# Patient Record
Sex: Female | Born: 1965 | Race: White | Hispanic: No | Smoking: Never smoker
Health system: Southern US, Community
[De-identification: ages and names within clinical notes are randomized; demographics above are authoritative.]

## PROBLEM LIST (undated history)

## (undated) ENCOUNTER — Inpatient Hospital Stay (HOSPITAL_COMMUNITY): Payer: PRIVATE HEALTH INSURANCE

## (undated) DIAGNOSIS — F419 Anxiety disorder, unspecified: Secondary | ICD-10-CM

## (undated) DIAGNOSIS — K219 Gastro-esophageal reflux disease without esophagitis: Secondary | ICD-10-CM

## (undated) DIAGNOSIS — F32A Depression, unspecified: Secondary | ICD-10-CM

## (undated) DIAGNOSIS — F329 Major depressive disorder, single episode, unspecified: Secondary | ICD-10-CM

## (undated) HISTORY — PX: NO PAST SURGERIES: SHX2092

---

## 2001-02-12 ENCOUNTER — Other Ambulatory Visit: Admission: RE | Admit: 2001-02-12 | Discharge: 2001-02-12 | Payer: Self-pay | Admitting: Obstetrics and Gynecology

## 2003-04-19 ENCOUNTER — Other Ambulatory Visit: Admission: RE | Admit: 2003-04-19 | Discharge: 2003-04-19 | Payer: Self-pay | Admitting: Obstetrics and Gynecology

## 2005-03-12 ENCOUNTER — Other Ambulatory Visit: Admission: RE | Admit: 2005-03-12 | Discharge: 2005-03-12 | Payer: Self-pay | Admitting: Obstetrics and Gynecology

## 2005-03-14 ENCOUNTER — Encounter: Admission: RE | Admit: 2005-03-14 | Discharge: 2005-03-14 | Payer: Self-pay | Admitting: Obstetrics and Gynecology

## 2006-09-01 ENCOUNTER — Other Ambulatory Visit: Admission: RE | Admit: 2006-09-01 | Discharge: 2006-09-01 | Payer: Self-pay | Admitting: Obstetrics and Gynecology

## 2008-11-08 ENCOUNTER — Encounter: Admission: RE | Admit: 2008-11-08 | Discharge: 2008-11-08 | Payer: Self-pay | Admitting: Obstetrics and Gynecology

## 2009-11-22 ENCOUNTER — Encounter: Admission: RE | Admit: 2009-11-22 | Discharge: 2009-11-22 | Payer: Self-pay | Admitting: Obstetrics and Gynecology

## 2011-05-29 ENCOUNTER — Other Ambulatory Visit: Payer: Self-pay | Admitting: Obstetrics and Gynecology

## 2011-05-29 DIAGNOSIS — Z1231 Encounter for screening mammogram for malignant neoplasm of breast: Secondary | ICD-10-CM

## 2011-06-11 ENCOUNTER — Ambulatory Visit
Admission: RE | Admit: 2011-06-11 | Discharge: 2011-06-11 | Disposition: A | Discharge status: Home / Self Care | Payer: PRIVATE HEALTH INSURANCE | Source: Ambulatory Visit | Attending: Obstetrics and Gynecology | Admitting: Obstetrics and Gynecology

## 2011-06-11 DIAGNOSIS — Z1231 Encounter for screening mammogram for malignant neoplasm of breast: Secondary | ICD-10-CM

## 2011-06-25 ENCOUNTER — Encounter (HOSPITAL_COMMUNITY): Payer: Self-pay | Admitting: *Deleted

## 2011-07-05 ENCOUNTER — Other Ambulatory Visit: Payer: Self-pay | Admitting: Obstetrics and Gynecology

## 2011-07-05 ENCOUNTER — Encounter (HOSPITAL_COMMUNITY): Payer: Self-pay | Admitting: *Deleted

## 2011-07-05 ENCOUNTER — Ambulatory Visit (HOSPITAL_COMMUNITY): Payer: PRIVATE HEALTH INSURANCE | Admitting: Anesthesiology

## 2011-07-05 ENCOUNTER — Ambulatory Visit (HOSPITAL_COMMUNITY)
Admission: AD | Admit: 2011-07-05 | Discharge: 2011-07-05 | Disposition: A | Discharge status: Home / Self Care | Payer: PRIVATE HEALTH INSURANCE | Source: Ambulatory Visit | Attending: Obstetrics and Gynecology | Admitting: Obstetrics and Gynecology

## 2011-07-05 ENCOUNTER — Encounter (HOSPITAL_COMMUNITY)
Admission: AD | Disposition: A | Discharge status: Home / Self Care | Payer: Self-pay | Source: Ambulatory Visit | Attending: Obstetrics and Gynecology

## 2011-07-05 ENCOUNTER — Encounter (HOSPITAL_COMMUNITY): Payer: Self-pay | Admitting: Anesthesiology

## 2011-07-05 DIAGNOSIS — N949 Unspecified condition associated with female genital organs and menstrual cycle: Secondary | ICD-10-CM | POA: Insufficient documentation

## 2011-07-05 DIAGNOSIS — N938 Other specified abnormal uterine and vaginal bleeding: Secondary | ICD-10-CM | POA: Insufficient documentation

## 2011-07-05 DIAGNOSIS — N840 Polyp of corpus uteri: Secondary | ICD-10-CM | POA: Insufficient documentation

## 2011-07-05 DIAGNOSIS — F329 Major depressive disorder, single episode, unspecified: Secondary | ICD-10-CM | POA: Insufficient documentation

## 2011-07-05 DIAGNOSIS — N926 Irregular menstruation, unspecified: Secondary | ICD-10-CM | POA: Diagnosis present

## 2011-07-05 HISTORY — DX: Depression, unspecified: F32.A

## 2011-07-05 HISTORY — DX: Anxiety disorder, unspecified: F41.9

## 2011-07-05 HISTORY — DX: Gastro-esophageal reflux disease without esophagitis: K21.9

## 2011-07-05 HISTORY — DX: Major depressive disorder, single episode, unspecified: F32.9

## 2011-07-05 LAB — CBC
HCT: 38 % (ref 36.0–46.0)
WBC: 6.2 10*3/uL (ref 4.0–10.5)

## 2011-07-05 LAB — PREGNANCY, URINE: Preg Test, Ur: NEGATIVE

## 2011-07-05 SURGERY — DILATATION & CURETTAGE/HYSTEROSCOPY WITH RESECTOCOPE
Anesthesia: General

## 2011-07-05 MED ORDER — IBUPROFEN 200 MG PO TABS: 600.0000 mg | ORAL_TABLET | Freq: Four times a day (QID) | ORAL | Status: AC

## 2011-07-05 MED ORDER — PROPOFOL 10 MG/ML IV EMUL
INTRAVENOUS | Status: DC | PRN
  Administered 2011-07-05: 200 mg via INTRAVENOUS

## 2011-07-05 MED ORDER — DEXAMETHASONE SODIUM PHOSPHATE 10 MG/ML IJ SOLN
INTRAMUSCULAR | Status: AC
  Filled 2011-07-05: qty 1

## 2011-07-05 MED ORDER — KETOROLAC TROMETHAMINE 60 MG/2ML IM SOLN
INTRAMUSCULAR | Status: DC | PRN
  Administered 2011-07-05: 30 mg via INTRAMUSCULAR

## 2011-07-05 MED ORDER — FENTANYL CITRATE 0.05 MG/ML IJ SOLN
INTRAMUSCULAR | Status: DC | PRN
  Administered 2011-07-05 (×2): 50 ug via INTRAVENOUS

## 2011-07-05 MED ORDER — ONDANSETRON HCL 4 MG/2ML IJ SOLN: 4.0000 mg | Freq: Once | INTRAMUSCULAR | Status: DC | PRN

## 2011-07-05 MED ORDER — PROPOFOL 10 MG/ML IV EMUL
INTRAVENOUS | Status: AC
  Filled 2011-07-05: qty 20

## 2011-07-05 MED ORDER — GLYCINE 1.5 % IR SOLN
Status: DC | PRN
  Administered 2011-07-05: 1

## 2011-07-05 MED ORDER — LIDOCAINE HCL (CARDIAC) 20 MG/ML IV SOLN
INTRAVENOUS | Status: AC
  Filled 2011-07-05: qty 5

## 2011-07-05 MED ORDER — LIDOCAINE HCL (CARDIAC) 20 MG/ML IV SOLN
INTRAVENOUS | Status: DC | PRN
  Administered 2011-07-05: 100 mg via INTRAVENOUS

## 2011-07-05 MED ORDER — ONDANSETRON HCL 4 MG/2ML IJ SOLN
INTRAMUSCULAR | Status: AC
  Filled 2011-07-05: qty 2

## 2011-07-05 MED ORDER — KETOROLAC TROMETHAMINE 30 MG/ML IJ SOLN
INTRAMUSCULAR | Status: DC | PRN
  Administered 2011-07-05: 30 mg via INTRAVENOUS

## 2011-07-05 MED ORDER — ACETAMINOPHEN 325 MG PO TABS: 325.0000 mg | ORAL_TABLET | ORAL | Status: DC | PRN

## 2011-07-05 MED ORDER — FENTANYL CITRATE 0.05 MG/ML IJ SOLN
INTRAMUSCULAR | Status: AC
  Filled 2011-07-05: qty 2

## 2011-07-05 MED ORDER — MIDAZOLAM HCL 2 MG/2ML IJ SOLN
INTRAMUSCULAR | Status: AC
  Filled 2011-07-05: qty 2

## 2011-07-05 MED ORDER — MIDAZOLAM HCL 5 MG/5ML IJ SOLN
INTRAMUSCULAR | Status: DC | PRN
  Administered 2011-07-05: 2 mg via INTRAVENOUS

## 2011-07-05 MED ORDER — LACTATED RINGERS IV SOLN
INTRAVENOUS | Status: DC
  Administered 2011-07-05 (×5): via INTRAVENOUS

## 2011-07-05 MED ORDER — MEPERIDINE HCL 25 MG/ML IJ SOLN: 6.2500 mg | INTRAMUSCULAR | Status: DC | PRN

## 2011-07-05 MED ORDER — KETOROLAC TROMETHAMINE 30 MG/ML IJ SOLN
INTRAMUSCULAR | Status: AC
  Filled 2011-07-05: qty 1

## 2011-07-05 MED ORDER — ONDANSETRON HCL 4 MG/2ML IJ SOLN
INTRAMUSCULAR | Status: DC | PRN
  Administered 2011-07-05: 4 mg via INTRAVENOUS

## 2011-07-05 MED ORDER — DEXAMETHASONE SODIUM PHOSPHATE 10 MG/ML IJ SOLN
INTRAMUSCULAR | Status: DC | PRN
  Administered 2011-07-05: 10 mg via INTRAVENOUS

## 2011-07-05 MED ORDER — SODIUM CHLORIDE 0.9 % IR SOLN
Status: DC | PRN
  Administered 2011-07-05: 1000 mL

## 2011-07-05 MED ORDER — LIDOCAINE HCL 2 % IJ SOLN
INTRAMUSCULAR | Status: DC | PRN
  Administered 2011-07-05: 10 mL via INTRADERMAL

## 2011-07-05 MED ORDER — FENTANYL CITRATE 0.05 MG/ML IJ SOLN: 25.0000 ug | INTRAMUSCULAR | Status: DC | PRN

## 2011-07-05 SURGICAL SUPPLY — 25 items
BOOTIES KNEE HIGH SLOAN (MISCELLANEOUS) ×4 IMPLANT
CANISTER SUCTION 2500CC (MISCELLANEOUS) ×2 IMPLANT
CATH ROBINSON RED A/P 16FR (CATHETERS) ×2 IMPLANT
CLOTH BEACON ORANGE TIMEOUT ST (SAFETY) ×2 IMPLANT
CONTAINER PREFILL 10% NBF 60ML (FORM) ×4 IMPLANT
CORD ACTIVE DISPOSABLE (ELECTRODE) ×1
CORD ELECTRO ACTIVE DISP (ELECTRODE) ×1 IMPLANT
DILATOR CANAL MILEX (MISCELLANEOUS) IMPLANT
DRAPE UTILITY XL STRL (DRAPES) ×4 IMPLANT
ELECT LOOP GYNE PRO 24FR (CUTTING LOOP) ×2
ELECT REM PT RETURN 9FT ADLT (ELECTROSURGICAL) ×2
ELECT VAPORTRODE GRVD BAR (ELECTRODE) IMPLANT
ELECTRODE LOOP GYNE PRO 24FR (CUTTING LOOP) IMPLANT
ELECTRODE REM PT RTRN 9FT ADLT (ELECTROSURGICAL) ×1 IMPLANT
ELECTRODE ROLLER VERSAPOINT (ELECTRODE) IMPLANT
ELECTRODE RT ANGLE VERSAPOINT (CUTTING LOOP) IMPLANT
ELECTRODE TWIZZLE TIP (MISCELLANEOUS) IMPLANT
GLOVE ECLIPSE 6.0 STRL STRAW (GLOVE) ×2 IMPLANT
GLOVE INDICATOR 6.5 STRL GRN (GLOVE) ×2 IMPLANT
GLOVE SURG SS PI 6.5 STRL IVOR (GLOVE) ×4 IMPLANT
GOWN BRE IMP SLV AUR LG STRL (GOWN DISPOSABLE) ×4 IMPLANT
LOOP ANGLED CUTTING 22FR (CUTTING LOOP) IMPLANT
PACK HYSTEROSCOPY LF (CUSTOM PROCEDURE TRAY) ×2 IMPLANT
TOWEL OR 17X24 6PK STRL BLUE (TOWEL DISPOSABLE) ×4 IMPLANT
WATER STERILE IRR 1000ML POUR (IV SOLUTION) ×2 IMPLANT

## 2011-07-05 NOTE — Op Note (Signed)
PROCEDURE NOTE  INDICATIONS:abnormal uterine bleeding and menorrhagia Polyp on sonohystogram  PRE-OP DIAGNOSIS:menorrhagia;endometrial polyp   POST-OP DIAGNOSIS:same .  Procedure(s): DILATATION & CURRETTAGE/HYSTEROSCOPY WITH RESECTOCOPE   SURGEON:Lylla Eifler P  ASSIST:None  SPECIMENS:Endometrial currettings and polyp DISPOSITION OF SPECIMEN:  To Pathology FINDINGS: the uterus sounded to 9 cm. At the time of hysteroscopy the endometrium was fluffy throughout without a specific lesion identified.  EBL:<100cc  PATIENT TO PACU IN good CONDITION  PROCEDURE DETAILS:patient was taken to the operating room after appropriate identification and placed on the operating table in the lithotomy position. The perineum was prepped with multiple layers of Betadine and draped as a sterile field. A red Robinson catheter was used to empty the bladder. The perineum was draped as a sterile field. A Gray speculum was placed in the vagina and a paracervical block achieved a total of 10 cc of 2% Xylocaine and 10:00 positions. A single-tooth tenaculum was placed on the anterior cervix. The uterus sounded to 9 cm. The cervix was then dilated to accommodate the diagnostic hysteroscope. The hysteroscope was placed with the finding fluffy endometrium throughout oriented without a dominate polyp identified. The right TM left identified to ensure that the placement last intracavitary. The hysteroscope was removed and a medium-sized curet used to curet all 4 quadrants of the uterus. The hysteroscope was reinserted and with the finding of a clear endometrial cavity without specific lesions. The specimens were removed from the operative field and subsequently sent to pathology. The patient had all instruments removed from the vagina and was awakened from general anesthesia. She was taken to the recovery room in satisfactory condition having tolerated the procedure well the sponge and instrument counts correct.  Discharge  instructions: The patient is given printed discharge instructions from the [facility] for hysteroscopy.  Discharge medications.: See medical reconciliation.  Followup instructions: The patient will followup with Dr. Pennie Rushing in 2 weeks at [facility].

## 2011-07-05 NOTE — Anesthesia Procedure Notes (Signed)
Procedure Name: LMA Insertion Date/Time: 07/05/2011 9:12 AM Performed by: Harriett Rush, Marlies Ligman ADEDAYO Pre-anesthesia Checklist: Patient identified Patient Re-evaluated:Patient Re-evaluated prior to inductionOxygen Delivery Method: Circle System Utilized Preoxygenation: Pre-oxygenation with 100% oxygen Intubation Type: IV induction LMA: LMA inserted LMA Size: 4.0 Number of attempts: 1

## 2011-07-05 NOTE — Anesthesia Preprocedure Evaluation (Addendum)
Anesthesia Evaluation  Name, MR# and DOB Patient awake  General Assessment Comment  Reviewed: Allergy & Precautions, H&P , Patient's Chart, lab work & pertinent test results and reviewed documented beta blocker date and time   History of Anesthesia Complications (+) PONV  Airway Mallampati: II TM Distance: >3 FB Neck ROM: full    Dental No notable dental hx    Pulmonaryneg pulmonary ROS    clear to auscultation  pulmonary exam normal   Cardiovascular regular Normal   Neuro/PsychNegative Neurological ROS Negative Psych ROS  GI/Hepatic/Renal negative GI ROS, negative Liver ROS, and negative Renal ROS (+)  GERD Medicated and Controlled     Endo/Other  Negative Endocrine ROS (+)  Morbid obesity Abdominal   Musculoskeletal  Hematology negative hematology ROS (+)   Peds  Reproductive/Obstetrics   Anesthesia Other Findings              Anesthesia Physical Anesthesia Plan  ASA: II  Anesthesia Plan: General   Post-op Pain Management:    Induction: Intravenous  Airway Management Planned: LMA  Additional Equipment:   Intra-op Plan:   Post-operative Plan:   Informed Consent: I have reviewed the patients History and Physical, chart, labs and discussed the procedure including the risks, benefits and alternatives for the proposed anesthesia with the patient or authorized representative who has indicated his/her understanding and acceptance.   Dental Advisory Given  Plan Discussed with: CRNA and Surgeon  Anesthesia Plan Comments: (  Discussed  general anesthesia, including possible nausea, instrumentation of airway, sore throat,pulmonary aspiration, etc. I asked if the were any outstanding questions, or  concerns before we proceeded. )    Anesthesia Quick Evaluation

## 2011-07-05 NOTE — Anesthesia Postprocedure Evaluation (Signed)
  Anesthesia Post-op Note  Patient: Katelyn Collier  Procedure(s) Performed:  DILATATION & CURRETTAGE/HYSTEROSCOPY WITH RESECTOCOPE Patient is awake and responsive. Pain and nausea are reasonably well controlled. Vital signs are stable and clinically acceptable. Oxygen saturation is clinically acceptable. There are no apparent anesthetic complications at this time. Patient is ready for discharge.

## 2011-07-05 NOTE — H&P (Signed)
Update H&P No changes in H&P

## 2011-07-05 NOTE — Transfer of Care (Signed)
Immediate Anesthesia Transfer of Care Note  Patient: Katelyn Collier  Procedure(s) Performed:  DILATATION & CURRETTAGE/HYSTEROSCOPY WITH RESECTOCOPE  Patient Location: PACU  Anesthesia Type: General  Level of Consciousness: awake, alert  and oriented  Airway & Oxygen Therapy: Patient Spontanous Breathing, Patient connected to nasal cannula oxygen and Patient connected to face mask oxygen  Post-op Assessment: Report given to PACU RN, Post -op Vital signs reviewed and stable and Patient moving all extremities X 4  Post vital signs: Reviewed and stable  Complications: No apparent anesthesia complications

## 2011-07-10 NOTE — Addendum Note (Signed)
Addendum  created 07/10/11 1419 by Quinnlan Abruzzo Edward Amory Simonetti   Modules edited:Notes Section    

## 2013-04-01 ENCOUNTER — Other Ambulatory Visit: Payer: Self-pay

## 2013-04-01 DIAGNOSIS — Z1231 Encounter for screening mammogram for malignant neoplasm of breast: Secondary | ICD-10-CM

## 2013-04-12 ENCOUNTER — Ambulatory Visit
Admission: RE | Admit: 2013-04-12 | Discharge: 2013-04-12 | Disposition: A | Discharge status: Home / Self Care | Payer: BC Managed Care – PPO | Source: Ambulatory Visit

## 2013-04-12 DIAGNOSIS — Z1231 Encounter for screening mammogram for malignant neoplasm of breast: Secondary | ICD-10-CM

## 2018-02-05 ENCOUNTER — Other Ambulatory Visit: Payer: Self-pay | Admitting: Obstetrics and Gynecology

## 2018-02-05 DIAGNOSIS — N644 Mastodynia: Secondary | ICD-10-CM

## 2018-02-10 ENCOUNTER — Ambulatory Visit
Admission: RE | Admit: 2018-02-10 | Discharge: 2018-02-10 | Disposition: A | Discharge status: Home / Self Care | Payer: Managed Care, Other (non HMO) | Source: Ambulatory Visit | Attending: Obstetrics and Gynecology | Admitting: Obstetrics and Gynecology

## 2018-02-10 DIAGNOSIS — N644 Mastodynia: Secondary | ICD-10-CM

## 2018-02-17 ENCOUNTER — Other Ambulatory Visit: Payer: Self-pay | Admitting: Obstetrics and Gynecology

## 2022-05-15 NOTE — Progress Notes (Unsigned)
[facility] [address] [place] [address] Phone: [phone] Subjective:   Katelyn Collier, am serving as a scribe for Dr. Antoine Primas.  I'm seeing this patient by the request  of:  Haygood, Maris Berger, MD (Inactive)  CC: Hip and back pain   TIW:[id number]  Katelyn Collier is a 56 y.o. female coming in with complaint of hip and back pain. 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 Take tylenols to help with pain. Pain over SI mainly over right side. Burning pain in hip when sleeping. When going up steps pain on anterior thigh. Going through menopause. Took celebrex in Nov. Did PT. Works at [employer]. Feels better since taking 300mg  gabapentin. Patient denies any history of injury recently.  Patient feels like she is doing most of these things without any significant improvement yet.  Looking for other treatment options.      Past Medical History:  Diagnosis Date   Anxiety    Depression    GERD (gastroesophageal reflux disease)    generic prilosec   Past Surgical History:  Procedure Laterality Date   NO PAST SURGERIES     Social History   Socioeconomic History   Marital status: Significant Other    Spouse name: Not on file   Number of children: Not on file   Years of education: Not on file   Highest education level: Not on file  Occupational History   Not on file  Tobacco Use   Smoking status: Never   Smokeless tobacco: Not on file  Substance and Sexual Activity   Alcohol use: Yes   Drug use: No   Sexual activity: Yes    Birth control/protection: Condom  Other Topics Concern   Not on file  Social History Narrative   Not on file   Social Determinants of Health   Financial Resource Strain: Not on file  Food Insecurity: Not on file  Transportation Needs: Not on file  Physical Activity: Not on file  Stress: Not on file  Social Connections: Not on file   Allergies  Allergen Reactions   Latex Other (See Comments)    Unknown   Family History   Problem Relation Age of Onset   Breast cancer Paternal Aunt 46       x2      Current Outpatient Medications (Respiratory):    cetirizine (ZYRTEC) 5 MG tablet, Take 5 mg by mouth daily as needed. For allergies     loratadine (CLARITIN) 10 MG tablet, Take 10 mg by mouth daily as needed. For allergies      Current Outpatient Medications (Other):    ALPRAZolam (XANAX) 0.25 MG tablet, Take 0.25 mg by mouth every 8 (eight) hours as needed. For anxiety     buPROPion (WELLBUTRIN XL) 150 MG 24 hr tablet, Take 150 mg by mouth daily.     ergocalciferol (VITAMIN D2) 50000 UNITS capsule, Take 50,000 Units by mouth once a week.     methylphenidate (CONCERTA) 54 MG CR tablet, Take 54 mg by mouth 2 (two) times daily.     omeprazole (PRILOSEC) 20 MG capsule, Take 20 mg by mouth daily as needed. For heartburn     venlafaxine (EFFEXOR-XR) 150 MG 24 hr capsule, Take 150 mg by mouth 2 (two) times daily.     Reviewed prior external information including notes and imaging from  primary care provider As well as notes that were available from care everywhere and other healthcare systems.  Past medical history, social,  surgical and family history all reviewed in electronic medical record.  No pertanent information unless stated regarding to the chief complaint.   Review of Systems:  No headache, visual changes, nausea, vomiting, diarrhea, constipation, dizziness, abdominal pain, skin rash, fevers, chills, night sweats, weight loss, swollen lymph nodes,, joint swelling, chest pain, shortness of breath, mood changes. POSITIVE muscle aches, palpitations but denies chest pain, body aches  Objective  Blood pressure 132/86, pulse 89, height 5\' 3"  (1.6 m), weight 176 lb (79.8 kg), SpO2 96 %.   General: No apparent distress alert and oriented x3 mood and affect normal, dressed appropriately.  Very talkative HEENT: Pupils equal, extraocular movements intact  Respiratory: Patient's speak in full sentences and does  not appear short of breath  Cardiovascular: No lower extremity edema, non tender, no erythema  MSK: Patient back exam does have some loss of lordosis.  Some tenderness to palpation diffusely of the lumbar spine from the thoracolumbar juncture down. Patient is is have some limited rotation bilaterally. Symmetric strength of the lower extremities.  Deep tendon reflexes are intact.  Osteopathic findings T8 extended rotated and side bent left L5 flexed rotated and side bent left Sacrum right on right  97110; 15 additional minutes spent for Therapeutic exercises as stated in above notes.  This included exercises focusing on stretching, strengthening, with significant focus on eccentric aspects.   Long term goals include an improvement in range of motion, strength, endurance as well as avoiding reinjury. Patient's frequency would include in 1-2 times a day, 3-5 times a week for a duration of 6-12 weeks.Low back exercises that included:  Pelvic tilt/bracing instruction to focus on control of the pelvic girdle and lower abdominal muscles  Glute strengthening exercises, focusing on proper firing of the glutes without engaging the low back muscles Proper stretching techniques for maximum relief for the hamstrings, hip flexors, low back and some rotation where tolerated   Proper technique shown and discussed handout in great detail with ATC.  All questions were discussed and answered.      Impression and Recommendations:     The above documentation has been reviewed and is accurate and complete , DO

## 2022-05-16 ENCOUNTER — Ambulatory Visit (INDEPENDENT_AMBULATORY_CARE_PROVIDER_SITE_OTHER): Payer: No Typology Code available for payment source

## 2022-05-16 ENCOUNTER — Ambulatory Visit (INDEPENDENT_AMBULATORY_CARE_PROVIDER_SITE_OTHER): Payer: No Typology Code available for payment source | Admitting: Family Medicine

## 2022-05-16 VITALS — BP 132/86 | HR 89 | Ht 63.0 in | Wt 176.0 lb

## 2022-05-16 DIAGNOSIS — M545 Low back pain, unspecified: Secondary | ICD-10-CM

## 2022-05-16 DIAGNOSIS — M9902 Segmental and somatic dysfunction of thoracic region: Secondary | ICD-10-CM

## 2022-05-16 DIAGNOSIS — M9904 Segmental and somatic dysfunction of sacral region: Secondary | ICD-10-CM | POA: Diagnosis not present

## 2022-05-16 DIAGNOSIS — M549 Dorsalgia, unspecified: Secondary | ICD-10-CM

## 2022-05-16 DIAGNOSIS — G8929 Other chronic pain: Secondary | ICD-10-CM

## 2022-05-16 DIAGNOSIS — M9903 Segmental and somatic dysfunction of lumbar region: Secondary | ICD-10-CM

## 2022-05-16 DIAGNOSIS — R002 Palpitations: Secondary | ICD-10-CM | POA: Diagnosis not present

## 2022-05-16 NOTE — Assessment & Plan Note (Signed)
Patient does have intermittent radicular symptoms of the right back.  Discussed with patient about different treatment options and elected to try home exercises, icing regimen, topical anti-inflammatories.  Discussed core strengthening.attempted OMT discussed with patient know that I do not want to do this on a regular basis and try to potentially do this every 8-week intervals and then space out accordingly.  Worsening pain may need to consider advanced imaging but I think it would be highly unlikely.

## 2022-05-16 NOTE — Patient Instructions (Signed)
Xray today DHEA 50mg  daily for 4 weeks  Referral to cardio See you again in 2 months

## 2022-05-16 NOTE — Assessment & Plan Note (Signed)

## 2022-05-16 NOTE — Assessment & Plan Note (Signed)
Patient was showing me her Apple Watch which showed that the patient does sometimes get a heart rate in the 160's.  States that sometimes it feels like it is going very fast or very slow.  Sometimes feels lightheaded with it.  Discussed what signs and symptoms and when to seek medical attention.  Will refer to cardiology for further evaluation.

## 2022-05-19 NOTE — Progress Notes (Unsigned)
Cardiology Office Note   Date:  05/24/2022   ID:  Katelyn Collier, DOB 1966-04-03, MRN [id number]  PCP:  Hal Morales, MD (Inactive)  Cardiologist:   Brinlee Gambrell [place], MD   Chief Complaint  Patient presents with   Palpitations      History of Present Illness: Katelyn Collier is a 56 y.o. female who is seen at the request of Dr Katrinka Blazing for evaluation of palpitations. She is generally healthy. Over the past 3-4 months she has noted her heart racing. Will go up to the 130s for no obvious reason. She is menopausal. Has eliminated caffeine. She is active. Was seen in the ED on March 24 at [facility]. Evaluation there including labs, Ecg and CXR all normal.     Past Medical History:  Diagnosis Date   Anxiety    Depression    GERD (gastroesophageal reflux disease)    generic prilosec    Past Surgical History:  Procedure Laterality Date   NO PAST SURGERIES       Current Outpatient Medications  Medication Sig Dispense Refill   Ascorbic Acid (VITAMIN C WITH ROSE HIPS) 1000 MG tablet Take 1,000 mg by mouth daily.     Cholecalciferol (VITAMIN D3) 125 MCG (5000 UT) CAPS Take by mouth.     gabapentin (NEURONTIN) 300 MG capsule gabapentin 300 mg capsule  TAKE 1 CAPSULE BY MOUTH EVERY DAY AT BEDTIME FOR 30 DAYS     Potassium Gluconate 2.5 MEQ TABS Take 1 tablet by mouth daily.     Prasterone, DHEA, (DHEA 50) 50 MG CAPS Take by mouth.     Rhubarb (ESTROVEN COMPLETE PO) Take by mouth.     venlafaxine XR (EFFEXOR-XR) 150 MG 24 hr capsule Take by mouth.     No current facility-administered medications for this visit.    Allergies:   Latex    Social History:  The patient  reports that she has never smoked. She does not have any smokeless tobacco history on file. She reports current alcohol use. She reports that she does not use drugs.   Family History:  The patient's family history includes Breast cancer (age of onset: 107) in her paternal aunt; Cervical cancer in her mother;  Heart failure in her father; Kidney failure in her father.    ROS:  Please see the history of present illness.   Otherwise, review of systems are positive for none.   All other systems are reviewed and negative.    PHYSICAL EXAM: VS:  BP 122/72   Pulse 88   Ht 5\' 3"  (1.6 m)   Wt 177 lb 6.4 oz (80.5 kg)   SpO2 99%   BMI 31.42 kg/m  , BMI Body mass index is 31.42 kg/m. GEN: Well nourished, well developed, in no acute distress HEENT: normal Neck: no JVD, carotid bruits, or masses Cardiac: RRR; no murmurs, rubs, or gallops,no edema  Respiratory:  clear to auscultation bilaterally, normal work of breathing GI: soft, nontender, nondistended, + BS MS: no deformity or atrophy Skin: warm and dry, no rash Neuro:  Strength and sensation are intact Psych: euthymic mood, full affect   EKG:  EKG is ordered today. The ekg ordered today demonstrates NSR rate 88. Normal. I have personally reviewed and interpreted this study.    Recent Labs: No results found for requested labs within last 8760 hours.    Lipid Panel No results found for: CHOL, TRIG, HDL, CHOLHDL, VLDL, LDLCALC, LDLDIRECT   Dated 02/05/22: A1c  5.1%. cholesterol 283, triglycerides 75, HDL 99, LDL 172.  Dated 03/15/22: TSH 1.224. free T4 0.78, CMET and CBC normal. HS troponin normal.    Wt Readings from Last 3 Encounters:  05/24/22 177 lb 6.4 oz (80.5 kg)  05/16/22 176 lb (79.8 kg)  07/05/11 208 lb (94.3 kg)      Other studies Reviewed: Additional studies/ records that were reviewed today include: none. Review of the above records demonstrates: N/A   ASSESSMENT AND PLAN:  1.  Palpitations. Exam and initial evaluation all normal. May be related to menopause. Recommend a 2 week Zio patch monitor to make sure she isn't having a significant arrhythmia. Avoid stimulants.      Current medicines are reviewed at length with the patient today.  The patient does not have concerns regarding medicines.  The following  changes have been made:  no change  Labs/ tests ordered today include:   Orders Placed This Encounter  Procedures   LONG TERM MONITOR (3-14 DAYS)         Disposition:   FU TBD depending on monitor results  Signed, Sabreena Vogan [place], MD  05/24/2022 4:40 PM    [facility] [address] [place], [place], [address] Phone [phone], Fax [phone]

## 2022-05-24 ENCOUNTER — Encounter: Payer: Self-pay | Admitting: Cardiology

## 2022-05-24 ENCOUNTER — Ambulatory Visit: Payer: No Typology Code available for payment source | Admitting: Cardiology

## 2022-05-24 VITALS — BP 122/72 | HR 88 | Ht 63.0 in | Wt 177.4 lb

## 2022-05-24 DIAGNOSIS — R002 Palpitations: Secondary | ICD-10-CM

## 2022-05-24 NOTE — Patient Instructions (Addendum)
Medication Instructions:   NO CHANGES   *If you need a refill on your cardiac medications before your next appointment, please call your pharmacy*   Lab Work:  NOT NEEDED   Testing/Procedures: WILL BE MAILED TO YOUR HOME 3 TO 5 DAYS Your physician has recommended that you wear a holter monitor 14 DAY . Holter monitors are medical devices that record the heart's electrical activity. Doctors most often use these monitors to diagnose arrhythmias. Arrhythmias are problems with the speed or rhythm of the heartbeat. The monitor is a small, portable device. You can wear one while you do your normal daily activities. This is usually used to diagnose what is causing palpitations/syncope (passing out).    Follow-Up: At [facility], you and your health needs are our priority.  As part of our continuing mission to provide you with exceptional heart care, we have created designated Provider Care Teams.  These Care Teams include your primary Cardiologist (physician) and Advanced Practice Providers (APPs -  Physician Assistants and Nurse Practitioners) who all work together to provide you with the care you need, when you need it.     Your next appointment:   6 week(s)  The format for your next appointment:   In Person  Provider:   Peter [place], MD    Other Instructions   [facility]- Long Term Monitor Instructions  Your physician has requested you wear a ZIO patch monitor for 14 days.  This is a single patch monitor. [employer] supplies one patch monitor per enrollment. Additional stickers are not available. Please do not apply patch if you will be having a Nuclear Stress Test,  Echocardiogram, Cardiac CT, MRI, or Chest Xray during the period you would be wearing the  monitor. The patch cannot be worn during these tests. You cannot remove and re-apply the  ZIO XT patch monitor.  Your ZIO patch monitor will be mailed 3 day USPS to your address on file. It may take 3-5 days  to receive your  monitor after you have been enrolled.  Once you have received your monitor, please review the enclosed instructions. Your monitor  has already been registered assigning a specific monitor serial # to you.  Billing and Patient Assistance Program Information  We have supplied [employer] with any of your insurance information on file for billing purposes. [employer] offers a sliding scale Patient Assistance Program for patients that do not have  insurance, or whose insurance does not completely cover the cost of the ZIO monitor.  You must apply for the Patient Assistance Program to qualify for this discounted rate.  To apply, please call [employer] at [phone], select option 4, select option 2, ask to apply for  Patient Assistance Program. [name] will ask your household income, and how many people  are in your household. They will quote your out-of-pocket cost based on that information.  [employer] will also be able to set up a 46-month, interest-free payment plan if needed.  Applying the monitor   Shave hair from upper left chest.  Hold abrader disc by orange tab. Rub abrader in 40 strokes over the upper left chest as  indicated in your monitor instructions.  Clean area with 4 enclosed alcohol pads. Let dry.  Apply patch as indicated in monitor instructions. Patch will be placed under collarbone on left  side of chest with arrow pointing upward.  Rub patch adhesive wings for 2 minutes. Remove white label marked "1". Remove the white  label marked "2". Rub patch adhesive  wings for 2 additional minutes.  While looking in a mirror, press and release button in center of patch. A small green light will  flash 3-4 times. This will be your only indicator that the monitor has been turned on.  Do not shower for the first 24 hours. You may shower after the first 24 hours.  Press the button if you feel a symptom. You will hear a small click. Record Date, Time and  Symptom in the Patient Logbook.  When you  are ready to remove the patch, follow instructions on the last 2 pages of Patient  Logbook. Stick patch monitor onto the last page of Patient Logbook.  Place Patient Logbook in the blue and white box. Use locking tab on box and tape box closed  securely. The blue and white box has prepaid postage on it. Please place it in the mailbox as  soon as possible. Your physician should have your test results approximately 7 days after the  monitor has been mailed back to [facility].  Call [facility] Customer Care at [phone] if you have questions regarding  your ZIO XT patch monitor. Call them immediately if you see an orange light blinking on your  monitor.  If your monitor falls off in less than 4 days, contact our Monitor department at [phone].  If your monitor becomes loose or falls off after 4 days call [employer] at [phone] for  suggestions on securing your monitor

## 2022-05-27 ENCOUNTER — Ambulatory Visit (INDEPENDENT_AMBULATORY_CARE_PROVIDER_SITE_OTHER): Payer: No Typology Code available for payment source

## 2022-05-27 DIAGNOSIS — R002 Palpitations: Secondary | ICD-10-CM

## 2022-05-27 NOTE — Progress Notes (Unsigned)
Enrolled for [employer] to mail a ZIO XT long term holter monitor to the patients address on file.  

## 2022-05-27 NOTE — Addendum Note (Signed)
Addended by: Almedia Balls on: 05/27/2022 08:16 AM   Modules accepted: Orders

## 2022-05-30 DIAGNOSIS — R002 Palpitations: Secondary | ICD-10-CM

## 2022-06-28 NOTE — Progress Notes (Unsigned)
Cardiology Office Note   Date:  06/28/2022   ID:  NA WALDRIP, DOB 1966/04/12, MRN [id number]  PCP:  Hal Morales, MD (Inactive)  Cardiologist:   Gilberto Stanforth [place], MD   No chief complaint on file.     History of Present Illness: Katelyn Collier is a 56 y.o. female who is seen at the request of Dr Katrinka Blazing for evaluation of palpitations. She is generally healthy. Over the past 3-4 months she has noted her heart racing. Will go up to the 130s for no obvious reason. She is menopausal. Has eliminated caffeine. She is active. Was seen in the ED on March 24 at [facility]. Evaluation there including labs, Ecg and CXR all normal. We had her wear an event monitor which showed rare PACs and PVCs. Patient triggered events did not correlate with arrhythmia.     Past Medical History:  Diagnosis Date   Anxiety    Depression    GERD (gastroesophageal reflux disease)    generic prilosec    Past Surgical History:  Procedure Laterality Date   NO PAST SURGERIES       Current Outpatient Medications  Medication Sig Dispense Refill   Ascorbic Acid (VITAMIN C WITH ROSE HIPS) 1000 MG tablet Take 1,000 mg by mouth daily.     Cholecalciferol (VITAMIN D3) 125 MCG (5000 UT) CAPS Take by mouth.     gabapentin (NEURONTIN) 300 MG capsule gabapentin 300 mg capsule  TAKE 1 CAPSULE BY MOUTH EVERY DAY AT BEDTIME FOR 30 DAYS     Potassium Gluconate 2.5 MEQ TABS Take 1 tablet by mouth daily.     Prasterone, DHEA, (DHEA 50) 50 MG CAPS Take by mouth.     Rhubarb (ESTROVEN COMPLETE PO) Take by mouth.     venlafaxine XR (EFFEXOR-XR) 150 MG 24 hr capsule Take by mouth.     No current facility-administered medications for this visit.    Allergies:   Latex    Social History:  The patient  reports that she has never smoked. She does not have any smokeless tobacco history on file. She reports current alcohol use. She reports that she does not use drugs.   Family History:  The patient's family history  includes Breast cancer (age of onset: 39) in her paternal aunt; Cervical cancer in her mother; Heart failure in her father; Kidney failure in her father.    ROS:  Please see the history of present illness.   Otherwise, review of systems are positive for none.   All other systems are reviewed and negative.    PHYSICAL EXAM: VS:  There were no vitals taken for this visit. , BMI There is no height or weight on file to calculate BMI. GEN: Well nourished, well developed, in no acute distress HEENT: normal Neck: no JVD, carotid bruits, or masses Cardiac: RRR; no murmurs, rubs, or gallops,no edema  Respiratory:  clear to auscultation bilaterally, normal work of breathing GI: soft, nontender, nondistended, + BS MS: no deformity or atrophy Skin: warm and dry, no rash Neuro:  Strength and sensation are intact Psych: euthymic mood, full affect   EKG:  EKG is ordered today. The ekg ordered today demonstrates NSR rate 88. Normal. I have personally reviewed and interpreted this study.    Recent Labs: No results found for requested labs within last 365 days.    Lipid Panel No results found for: "CHOL", "TRIG", "HDL", "CHOLHDL", "VLDL", "LDLCALC", "LDLDIRECT"   Dated 02/05/22: A1c 5.1%. cholesterol 283,  triglycerides 75, HDL 99, LDL 172.  Dated 03/15/22: TSH 1.224. free T4 0.78, CMET and CBC normal. HS troponin normal.    Wt Readings from Last 3 Encounters:  05/24/22 177 lb 6.4 oz (80.5 kg)  05/16/22 176 lb (79.8 kg)  07/05/11 208 lb (94.3 kg)      Other studies Reviewed: Additional studies/ records that were reviewed today include:  Study Highlights    Normal sinus rhythm Rare PACs and PVCs Patient triggered events do not correlate with any arrhythmia     Patch Wear Time:  18 days and 2 hours (2023-06-08T08:00:57-0400 to 2023-06-29T07:21:53-0400)   Monitor 1 Patient had a min HR of 59 bpm, max HR of 134 bpm, and avg HR of 87 bpm. Predominant underlying rhythm was Sinus Rhythm.  Isolated SVEs were rare (<1.0%), and no SVE Couplets or SVE Triplets were present. Isolated VEs were rare (<1.0%), and no VE Couplets  or VE Triplets were present.    Monitor 2 Patient had a min HR of 59 bpm, max HR of 164 bpm, and avg HR of 88 bpm. Predominant underlying rhythm was Sinus Rhythm. Isolated SVEs were rare (<1.0%), SVE Couplets were rare (<1.0%), and no SVE Triplets were present. Isolated VEs were rare (<1.0%),  and no VE Couplets or VE Triplets were present. Ventricular Bigeminy and Trigeminy were present.   ASSESSMENT AND PLAN:  1.  Palpitations. Exam and initial evaluation all normal. May be related to menopause. Recommend a 2 week Zio patch monitor to make sure she isn't having a significant arrhythmia. Avoid stimulants.      Current medicines are reviewed at length with the patient today.  The patient does not have concerns regarding medicines.  The following changes have been made:  no change  Labs/ tests ordered today include:   No orders of the defined types were placed in this encounter.        Disposition:   FU TBD depending on monitor results  Signed, Jakub Debold [place], MD  06/28/2022 2:05 PM    [facility] [address], [place], [place], [address] Phone [phone], Fax [phone]

## 2022-07-01 ENCOUNTER — Ambulatory Visit (INDEPENDENT_AMBULATORY_CARE_PROVIDER_SITE_OTHER): Payer: No Typology Code available for payment source | Admitting: Cardiology

## 2022-07-01 ENCOUNTER — Encounter: Payer: Self-pay | Admitting: Cardiology

## 2022-07-01 VITALS — BP 126/83 | HR 80 | Ht 63.0 in | Wt 176.0 lb

## 2022-07-01 DIAGNOSIS — R002 Palpitations: Secondary | ICD-10-CM

## 2022-07-17 NOTE — Progress Notes (Deleted)
  [name] [address] [place] [address] Phone: [phone] Subjective:    I'm seeing this patient by the request  of:  Haygood, Maris Berger, MD (Inactive)  CC:   [id number]  Katelyn Collier is a 56 y.o. female coming in with complaint of back and neck pain. OMT on 05/16/2022. Patient states   Medications patient has been prescribed: None  Taking:         Reviewed prior external information including notes and imaging from previsou exam, outside providers and external EMR if available.   As well as notes that were available from care everywhere and other healthcare systems.  Past medical history, social, surgical and family history all reviewed in electronic medical record.  No pertanent information unless stated regarding to the chief complaint.   Past Medical History:  Diagnosis Date   Anxiety    Depression    GERD (gastroesophageal reflux disease)    generic prilosec    Allergies  Allergen Reactions   Latex Other (See Comments)    Unknown     Review of Systems:  No headache, visual changes, nausea, vomiting, diarrhea, constipation, dizziness, abdominal pain, skin rash, fevers, chills, night sweats, weight loss, swollen lymph nodes, body aches, joint swelling, chest pain, shortness of breath, mood changes. POSITIVE muscle aches  Objective  There were no vitals taken for this visit.   General: No apparent distress alert and oriented x3 mood and affect normal, dressed appropriately.  HEENT: Pupils equal, extraocular movements intact  Respiratory: Patient's speak in full sentences and does not appear short of breath  Cardiovascular: No lower extremity edema, non tender, no erythema  Gait MSK:  Back   Osteopathic findings  C2 flexed rotated and side bent right C6 flexed rotated and side bent left T3 extended rotated and side bent right inhaled rib T9 extended rotated and side bent left L2 flexed rotated and side  bent right Sacrum right on right       Assessment and Plan:  No problem-specific Assessment & Plan notes found for this encounter.    Nonallopathic problems  Decision today to treat with OMT was based on Physical Exam  After verbal consent patient was treated with HVLA, ME, FPR techniques in cervical, rib, thoracic, lumbar, and sacral  areas  Patient tolerated the procedure well with improvement in symptoms  Patient given exercises, stretches and lifestyle modifications  See medications in patient instructions if given  Patient will follow up in 4-8 weeks             Note: This dictation was prepared with Dragon dictation along with smaller phrase technology. Any transcriptional errors that result from this process are unintentional.

## 2022-07-18 ENCOUNTER — Ambulatory Visit: Payer: No Typology Code available for payment source | Admitting: Family Medicine

## 2022-10-03 ENCOUNTER — Ambulatory Visit: Payer: No Typology Code available for payment source | Admitting: Family Medicine

## 2024-01-29 IMAGING — DX DG HIP (WITH OR WITHOUT PELVIS) 2-3V*R*
3 series · 3 of 3 positions shown · non-contrast
Comparison: Hip x-rays 09/19/2021

CLINICAL DATA: Hip pain.

EXAM:
DG HIP (WITH OR WITHOUT PELVIS) 2-3V RIGHT

[pelvis ap]
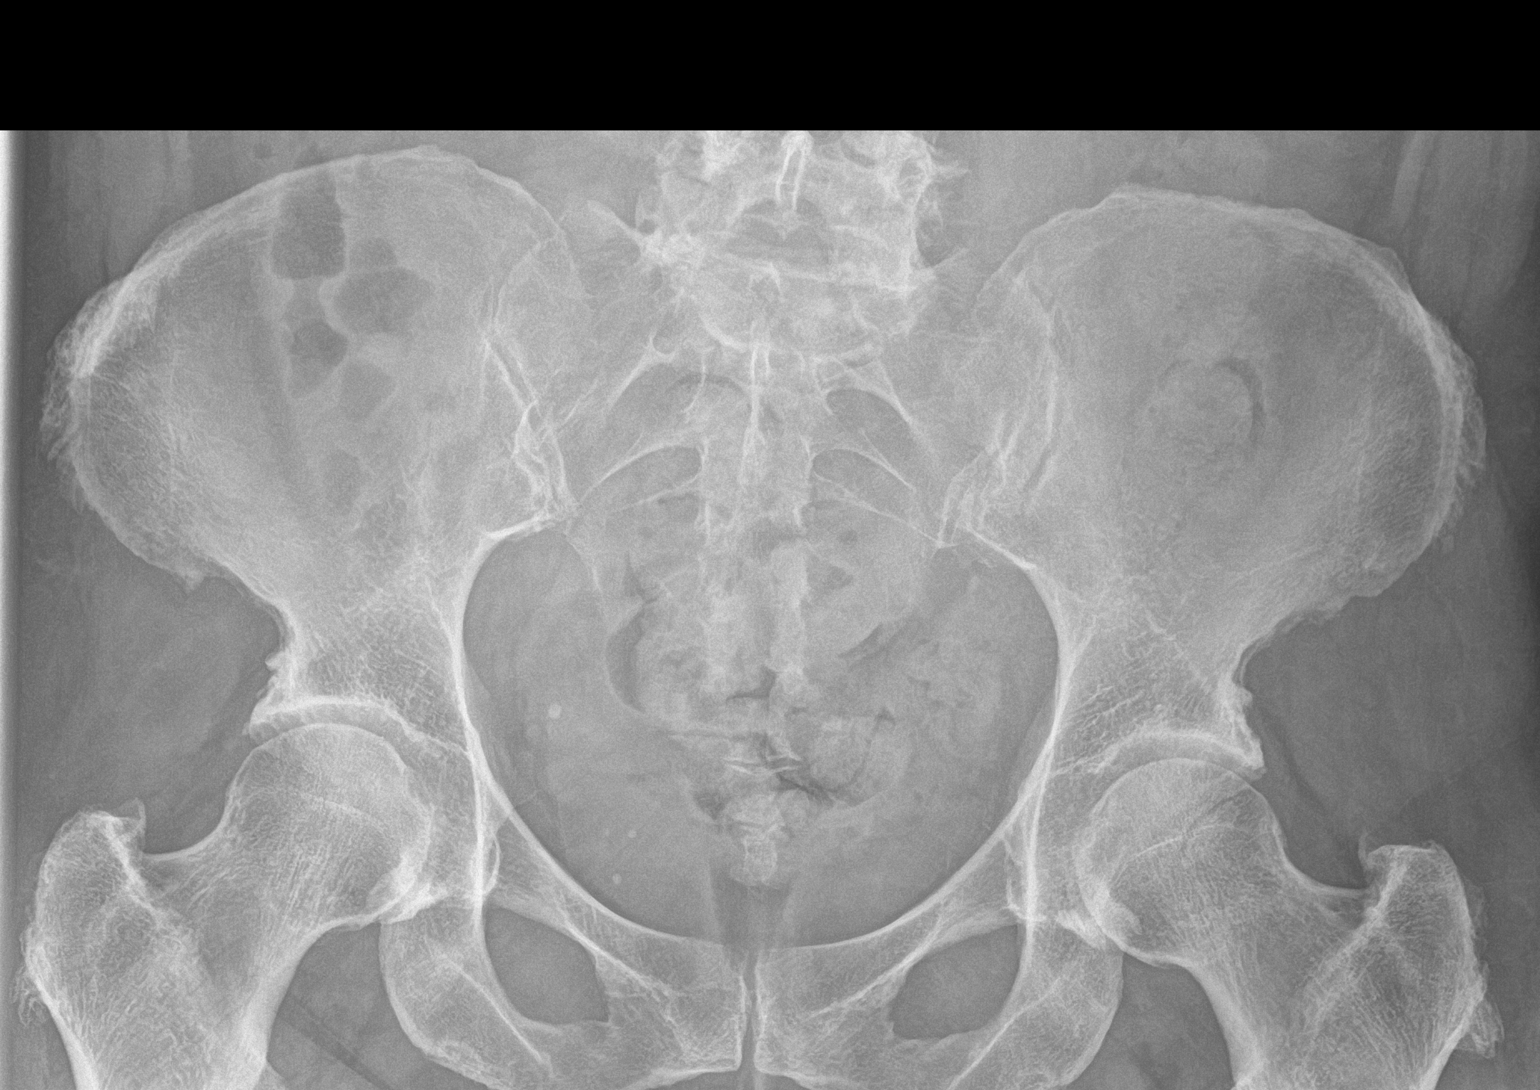

[hip ap]
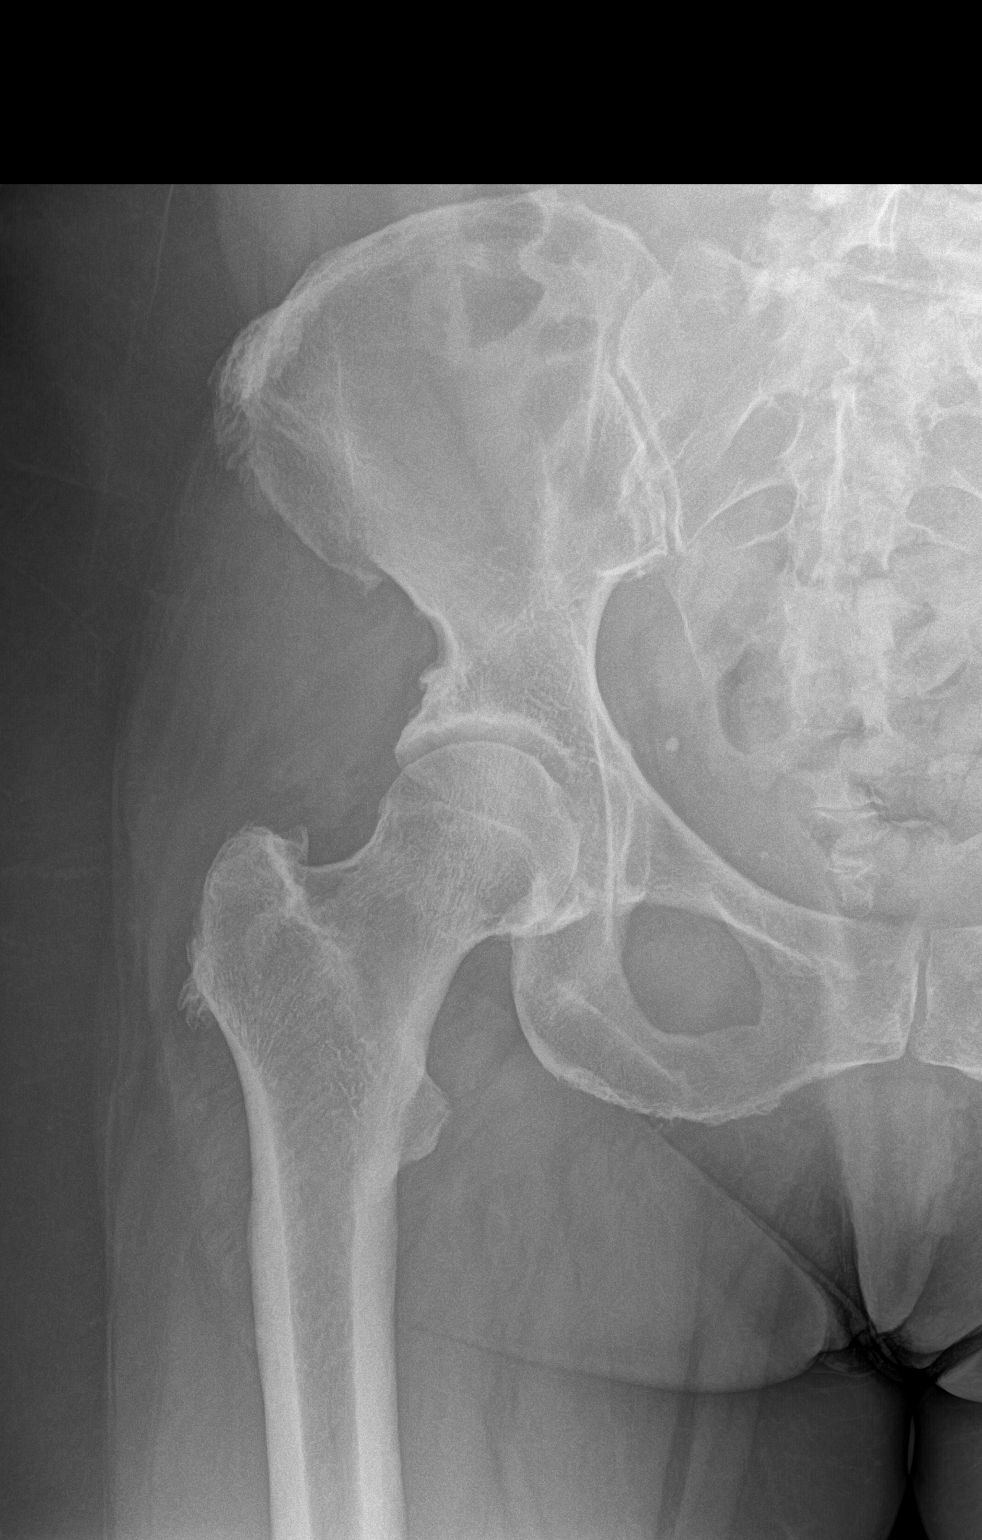

[hip frog leg]
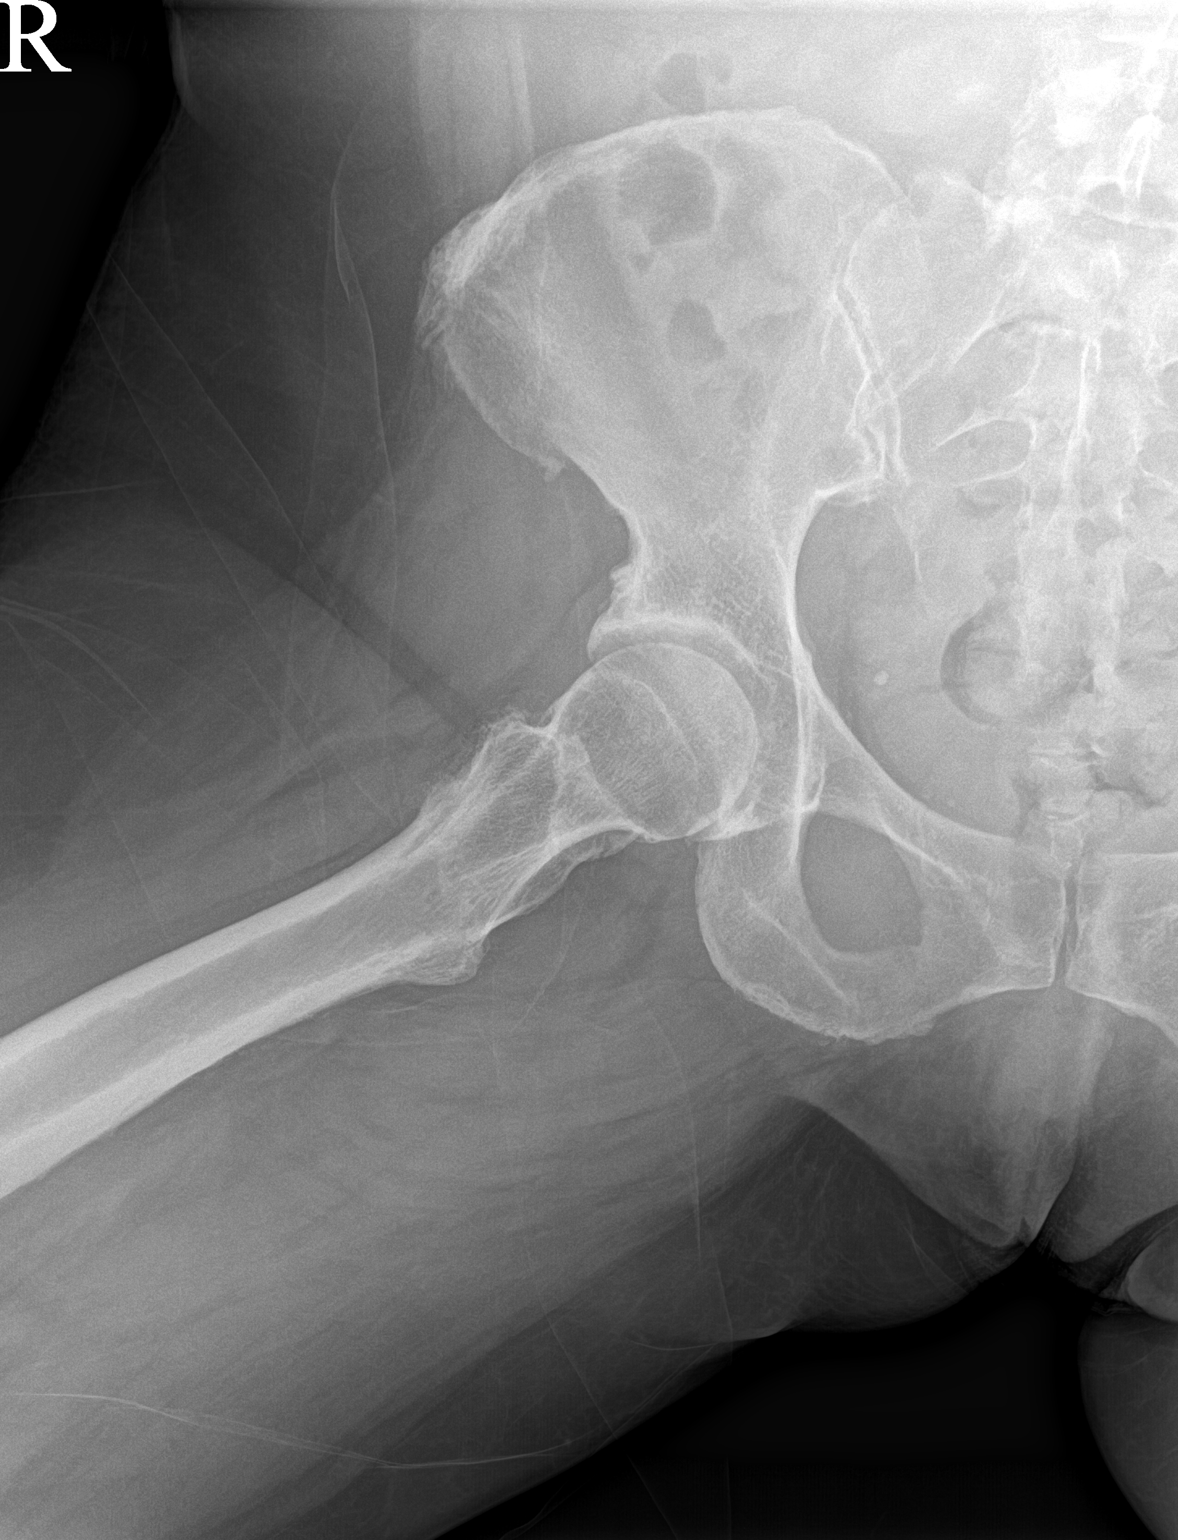

[3 of 3 positions shown; findings below may reference images not displayed]

FINDINGS: There is no evidence of hip fracture or dislocation. There is no
evidence of arthropathy or other focal bone abnormality.
IMPRESSION: Negative.

## 2024-07-27 ENCOUNTER — Other Ambulatory Visit: Payer: Self-pay | Admitting: Medical Genetics

## 2024-08-18 ENCOUNTER — Other Ambulatory Visit: Payer: Self-pay

## 2024-08-18 DIAGNOSIS — Z006 Encounter for examination for normal comparison and control in clinical research program: Secondary | ICD-10-CM

## 2024-08-27 LAB — GENECONNECT MOLECULAR SCREEN: Genetic Analysis Overall Interpretation: NEGATIVE
